# Patient Record
Sex: Male | Born: 1950 | Race: White | Hispanic: No | Marital: Married | Smoking: Current every day smoker
Health system: Southern US, Community
[De-identification: ages and names within clinical notes are randomized; demographics above are authoritative.]

## PROBLEM LIST (undated history)

## (undated) DIAGNOSIS — I10 Essential (primary) hypertension: Secondary | ICD-10-CM

---

## 2015-07-27 ENCOUNTER — Ambulatory Visit: Payer: Self-pay

## 2015-07-27 ENCOUNTER — Ambulatory Visit
Admission: EM | Admit: 2015-07-27 | Discharge: 2015-07-27 | Disposition: A | Discharge status: Home / Self Care | Payer: Self-pay | Attending: Family Medicine | Admitting: Family Medicine

## 2015-07-27 DIAGNOSIS — R509 Fever, unspecified: Secondary | ICD-10-CM | POA: Insufficient documentation

## 2015-07-27 DIAGNOSIS — E876 Hypokalemia: Secondary | ICD-10-CM | POA: Insufficient documentation

## 2015-07-27 DIAGNOSIS — R51 Headache: Secondary | ICD-10-CM | POA: Insufficient documentation

## 2015-07-27 DIAGNOSIS — R519 Headache, unspecified: Secondary | ICD-10-CM

## 2015-07-27 LAB — CBC WITH DIFFERENTIAL/PLATELET
Basophils Absolute: 0.1 10*3/uL (ref 0–0.1)
Basophils Relative: 1 %
Eosinophils Absolute: 0 10*3/uL (ref 0–0.7)
Eosinophils Relative: 0 %
HCT: 45.2 % (ref 40.0–52.0)
Hemoglobin: 15 g/dL (ref 13.0–18.0)
Lymphocytes Relative: 16 %
Lymphs Abs: 1.7 10*3/uL (ref 1.0–3.6)
MCH: 28.9 pg (ref 26.0–34.0)
MCHC: 33.2 g/dL (ref 32.0–36.0)
MCV: 87 fL (ref 80.0–100.0)
Monocytes Absolute: 0.9 10*3/uL (ref 0.2–1.0)
Monocytes Relative: 9 %
Neutro Abs: 7.7 10*3/uL — ABNORMAL HIGH (ref 1.4–6.5)
Neutrophils Relative %: 74 %
Platelets: 167 10*3/uL (ref 150–440)
RBC: 5.19 MIL/uL (ref 4.40–5.90)
RDW: 14.8 % — ABNORMAL HIGH (ref 11.5–14.5)
WBC: 10.5 10*3/uL (ref 3.8–10.6)

## 2015-07-27 LAB — COMPREHENSIVE METABOLIC PANEL
ALT: 31 U/L (ref 17–63)
AST: 35 U/L (ref 15–41)
Albumin: 4.3 g/dL (ref 3.5–5.0)
Alkaline Phosphatase: 106 U/L (ref 38–126)
Anion gap: 10 (ref 5–15)
BUN: 11 mg/dL (ref 6–20)
CO2: 23 mmol/L (ref 22–32)
Calcium: 8.8 mg/dL — ABNORMAL LOW (ref 8.9–10.3)
Chloride: 102 mmol/L (ref 101–111)
Creatinine, Ser: 1 mg/dL (ref 0.61–1.24)
GFR calc Af Amer: >60 mL/min (ref >60)
GFR calc non Af Amer: >60 mL/min (ref >60)
Glucose, Bld: 127 mg/dL — ABNORMAL HIGH (ref 65–99)
Potassium: 3.1 mmol/L — ABNORMAL LOW (ref 3.5–5.1)
Sodium: 135 mmol/L (ref 135–145)
Total Bilirubin: 1.4 mg/dL — ABNORMAL HIGH (ref 0.3–1.2)
Total Protein: 7.6 g/dL (ref 6.5–8.1)

## 2015-07-27 MED ORDER — NAPROXEN 500 MG PO TABS: 500.0000 mg | ORAL_TABLET | Freq: Two times a day (BID) | ORAL | Status: AC

## 2015-07-27 NOTE — Discharge Instructions (Signed)
Fever, Adult °A fever is a higher than normal body temperature. In an adult, an oral temperature around 98.6° F (37° C) is considered normal. A temperature of 100.4° F (38° C) or higher is generally considered a fever. Mild or moderate fevers generally have no long-term effects and often do not require treatment. Extreme fever (greater than or equal to 106° F or 41.1° C) can cause seizures. The sweating that may occur with repeated or prolonged fever may cause dehydration. Elderly people can develop confusion during a fever. °A measured temperature can vary with: °· Age. °· Time of day. °· Method of measurement (mouth, underarm, rectal, or ear). °The fever is confirmed by taking a temperature with a thermometer. Temperatures can be taken different ways. Some methods are accurate and some are not. °· An oral temperature is used most commonly. Electronic thermometers are fast and accurate. °· An ear temperature will only be accurate if the thermometer is positioned as recommended by the manufacturer. °· A rectal temperature is accurate and done for those adults who have a condition where an oral temperature cannot be taken. °· An underarm (axillary) temperature is not accurate and not recommended. °Fever is a symptom, not a disease.  °CAUSES  °· Infections commonly cause fever. °· Some noninfectious causes for fever include: °· Some arthritis conditions. °· Some thyroid or adrenal gland conditions. °· Some immune system conditions. °· Some types of cancer. °· A medicine reaction. °· High doses of certain street drugs such as methamphetamine. °· Dehydration. °· Exposure to high outside or room temperatures. °· Occasionally, the source of a fever cannot be determined. This is sometimes called a "fever of unknown origin" (FUO). °· Some situations may lead to a temporary rise in body temperature that may go away on its own. Examples are: °· Childbirth. °· Surgery. °· Intense exercise. °HOME CARE INSTRUCTIONS  °· Take  appropriate medicines for fever. Follow dosing instructions carefully. If you use acetaminophen to reduce the fever, be careful to avoid taking other medicines that also contain acetaminophen. Do not take aspirin for a fever if you are younger than age 19. There is an association with Reye's syndrome. Reye's syndrome is a rare but potentially deadly disease. °· If an infection is present and antibiotics have been prescribed, take them as directed. Finish them even if you start to feel better. °· Rest as needed. °· Maintain an adequate fluid intake. To prevent dehydration during an illness with prolonged or recurrent fever, you may need to drink extra fluid. Drink enough fluids to keep your urine clear or pale yellow. °· Sponging or bathing with room temperature water may help reduce body temperature. Do not use ice water or alcohol sponge baths. °· Dress comfortably, but do not over-bundle. °SEEK MEDICAL CARE IF:  °· You are unable to keep fluids down. °· You develop vomiting or diarrhea. °· You are not feeling at least partly better after 3 days. °· You develop new symptoms or problems. °SEEK IMMEDIATE MEDICAL CARE IF:  °· You have shortness of breath or trouble breathing. °· You develop excessive weakness. °· You are dizzy or you faint. °· You are extremely thirsty or you are making little or no urine. °· You develop new pain that was not there before (such as in the head, neck, chest, back, or abdomen). °· You have persistent vomiting and diarrhea for more than 1 to 2 days. °· You develop a stiff neck or your eyes become sensitive to light. °· You develop a   skin rash.  You have a fever or persistent symptoms for more than 2 to 3 days.  You have a fever and your symptoms suddenly get worse. MAKE SURE YOU:   Understand these instructions.  Will watch your condition.  Will get help right away if you are not doing well or get worse. Document Released: 05/22/2001 Document Revised: 04/12/2014 Document  Reviewed: 09/27/2011 [facility] Patient Information 2015 [place], [place]. This information is not intended to replace advice given to you by your health care provider. Make sure you discuss any questions you have with your health care provider.  Headaches, Frequently Asked Questions MIGRAINE HEADACHES Q: What is migraine? What causes it? How can I treat it? A: Generally, migraine headaches begin as a dull ache. Then they develop into a constant, throbbing, and pulsating pain. You may experience pain at the temples. You may experience pain at the front or back of one or both sides of the head. The pain is usually accompanied by a combination of:  Nausea.  Vomiting.  Sensitivity to light and noise. Some people (about 15%) experience an aura (see below) before an attack. The cause of migraine is believed to be chemical reactions in the brain. Treatment for migraine may include over-the-counter or prescription medications. It may also include self-help techniques. These include relaxation training and biofeedback.  Q: What is an aura? A: About 15% of people with migraine get an "aura". This is a sign of neurological symptoms that occur before a migraine headache. You may see wavy or jagged lines, dots, or flashing lights. You might experience tunnel vision or blind spots in one or both eyes. The aura can include visual or auditory hallucinations (something imagined). It may include disruptions in smell (such as strange odors), taste or touch. Other symptoms include:  Numbness.  A "pins and needles" sensation.  Difficulty in recalling or speaking the correct word. These neurological events may last as long as 60 minutes. These symptoms will fade as the headache begins. Q: What is a trigger? A: Certain physical or environmental factors can lead to or "trigger" a migraine. These include:  Foods.  Hormonal changes.  Weather.  Stress. It is important to remember that triggers are different for  everyone. To help prevent migraine attacks, you need to figure out which triggers affect you. Keep a headache diary. This is a good way to track triggers. The diary will help you talk to your healthcare professional about your condition. Q: Does weather affect migraines? A: Bright sunshine, hot, humid conditions, and drastic changes in barometric pressure may lead to, or "trigger," a migraine attack in some people. But studies have shown that weather does not act as a trigger for everyone with migraines. Q: What is the link between migraine and hormones? A: Hormones start and regulate many of your body's functions. Hormones keep your body in balance within a constantly changing environment. The levels of hormones in your body are unbalanced at times. Examples are during menstruation, pregnancy, or menopause. That can lead to a migraine attack. In fact, about three quarters of all women with migraine report that their attacks are related to the menstrual cycle.  Q: Is there an increased risk of stroke for migraine sufferers? A: The likelihood of a migraine attack causing a stroke is very remote. That is not to say that migraine sufferers cannot have a stroke associated with their migraines. In persons under age 61, the most common associated factor for stroke is migraine headache. But over the  course of a person's normal life span, the occurrence of migraine headache may actually be associated with a reduced risk of dying from cerebrovascular disease due to stroke.  Q: What are acute medications for migraine? A: Acute medications are used to treat the pain of the headache after it has started. Examples over-the-counter medications, NSAIDs, ergots, and triptans.  Q: What are the triptans? A: Triptans are the newest class of abortive medications. They are specifically targeted to treat migraine. Triptans are vasoconstrictors. They moderate some chemical reactions in the brain. The triptans work on receptors  in your brain. Triptans help to restore the balance of a neurotransmitter called serotonin. Fluctuations in levels of serotonin are thought to be a main cause of migraine.  Q: Are over-the-counter medications for migraine effective? A: Over-the-counter, or "OTC," medications may be effective in relieving mild to moderate pain and associated symptoms of migraine. But you should see your caregiver before beginning any treatment regimen for migraine.  Q: What are preventive medications for migraine? A: Preventive medications for migraine are sometimes referred to as "prophylactic" treatments. They are used to reduce the frequency, severity, and length of migraine attacks. Examples of preventive medications include antiepileptic medications, antidepressants, beta-blockers, calcium channel blockers, and NSAIDs (nonsteroidal anti-inflammatory drugs). Q: Why are anticonvulsants used to treat migraine? A: During the past few years, there has been an increased interest in antiepileptic drugs for the prevention of migraine. They are sometimes referred to as "anticonvulsants". Both epilepsy and migraine may be caused by similar reactions in the brain.  Q: Why are antidepressants used to treat migraine? A: Antidepressants are typically used to treat people with depression. They may reduce migraine frequency by regulating chemical levels, such as serotonin, in the brain.  Q: What alternative therapies are used to treat migraine? A: The term "alternative therapies" is often used to describe treatments considered outside the scope of conventional Western medicine. Examples of alternative therapy include acupuncture, acupressure, and yoga. Another common alternative treatment is herbal therapy. Some herbs are believed to relieve headache pain. Always discuss alternative therapies with your caregiver before proceeding. Some herbal products contain arsenic and other toxins. TENSION HEADACHES Q: What is a tension-type  headache? What causes it? How can I treat it? A: Tension-type headaches occur randomly. They are often the result of temporary stress, anxiety, fatigue, or anger. Symptoms include soreness in your temples, a tightening band-like sensation around your head (a "vice-like" ache). Symptoms can also include a pulling feeling, pressure sensations, and contracting head and neck muscles. The headache begins in your forehead, temples, or the back of your head and neck. Treatment for tension-type headache may include over-the-counter or prescription medications. Treatment may also include self-help techniques such as relaxation training and biofeedback. CLUSTER HEADACHES Q: What is a cluster headache? What causes it? How can I treat it? A: Cluster headache gets its name because the attacks come in groups. The pain arrives with little, if any, warning. It is usually on one side of the head. A tearing or bloodshot eye and a runny nose on the same side of the headache may also accompany the pain. Cluster headaches are believed to be caused by chemical reactions in the brain. They have been described as the most severe and intense of any headache type. Treatment for cluster headache includes prescription medication and oxygen. SINUS HEADACHES Q: What is a sinus headache? What causes it? How can I treat it? A: When a cavity in the bones of the face and skull (  a sinus) becomes inflamed, the inflammation will cause localized pain. This condition is usually the result of an allergic reaction, a tumor, or an infection. If your headache is caused by a sinus blockage, such as an infection, you will probably have a fever. An x-ray will confirm a sinus blockage. Your caregiver's treatment might include antibiotics for the infection, as well as antihistamines or decongestants.  REBOUND HEADACHES Q: What is a rebound headache? What causes it? How can I treat it? A: A pattern of taking acute headache medications too often can lead  to a condition known as "rebound headache." A pattern of taking too much headache medication includes taking it more than 2 days per week or in excessive amounts. That means more than the label or a caregiver advises. With rebound headaches, your medications not only stop relieving pain, they actually begin to cause headaches. Doctors treat rebound headache by tapering the medication that is being overused. Sometimes your caregiver will gradually substitute a different type of treatment or medication. Stopping may be a challenge. Regularly overusing a medication increases the potential for serious side effects. Consult a caregiver if you regularly use headache medications more than 2 days per week or more than the label advises. ADDITIONAL QUESTIONS AND ANSWERS Q: What is biofeedback? A: Biofeedback is a self-help treatment. Biofeedback uses special equipment to monitor your body's involuntary physical responses. Biofeedback monitors:  Breathing.  Pulse.  Heart rate.  Temperature.  Muscle tension.  Brain activity. Biofeedback helps you refine and perfect your relaxation exercises. You learn to control the physical responses that are related to stress. Once the technique has been mastered, you do not need the equipment any more. Q: Are headaches hereditary? A: Four out of five (80%) of people that suffer report a family history of migraine. Scientists are not sure if this is genetic or a family predisposition. Despite the uncertainty, a child has a 50% chance of having migraine if one parent suffers. The child has a 75% chance if both parents suffer.  Q: Can children get headaches? A: By the time they reach high school, most young people have experienced some type of headache. Many safe and effective approaches or medications can prevent a headache from occurring or stop it after it has begun.  Q: What type of doctor should I see to diagnose and treat my headache? A: Start with your primary  caregiver. Discuss his or her experience and approach to headaches. Discuss methods of classification, diagnosis, and treatment. Your caregiver may decide to recommend you to a headache specialist, depending upon your symptoms or other physical conditions. Having diabetes, allergies, etc., may require a more comprehensive and inclusive approach to your headache. The [employer] will provide, upon request, a list of [facility] physician members in your state. Document Released: 02/16/2004 Document Revised: 02/18/2012 Document Reviewed: 07/26/2008 [facility] Patient Information 2015 [place], [place]. This information is not intended to replace advice given to you by your health care provider. Make sure you discuss any questions you have with your health care provider.  Hypokalemia Hypokalemia means that the amount of potassium in the blood is lower than normal.Potassium is a chemical, called an electrolyte, that helps regulate the amount of fluid in the body. It also stimulates muscle contraction and helps nerves function properly.Most of the body's potassium is inside of cells, and only a very small amount is in the blood. Because the amount in the blood is so small, minor changes can be life-threatening. CAUSES  Antibiotics.  Diarrhea or vomiting.  Using laxatives too much, which can cause diarrhea.  Chronic kidney disease.  Water pills (diuretics).  Eating disorders (bulimia).  Low magnesium level.  Sweating a lot. SIGNS AND SYMPTOMS  Weakness.  Constipation.  Fatigue.  Muscle cramps.  Mental confusion.  Skipped heartbeats or irregular heartbeat (palpitations).  Tingling or numbness. DIAGNOSIS  Your health care provider can diagnose hypokalemia with blood tests. In addition to checking your potassium level, your health care provider may also check other lab tests. TREATMENT Hypokalemia can be treated with potassium supplements taken by mouth or adjustments in your  current medicines. If your potassium level is very low, you may need to get potassium through a vein (IV) and be monitored in the hospital. A diet high in potassium is also helpful. Foods high in potassium are:  Nuts, such as peanuts and pistachios.  Seeds, such as sunflower seeds and pumpkin seeds.  Peas, lentils, and lima beans.  Whole grain and bran cereals and breads.  Fresh fruit and vegetables, such as apricots, avocado, bananas, cantaloupe, kiwi, oranges, tomatoes, asparagus, and potatoes.  Orange and tomato juices.  Red meats.  Fruit yogurt. HOME CARE INSTRUCTIONS  Take all medicines as prescribed by your health care provider.  Maintain a healthy diet by including nutritious food, such as fruits, vegetables, nuts, whole grains, and lean meats.  If you are taking a laxative, be sure to follow the directions on the label. SEEK MEDICAL CARE IF:  Your weakness gets worse.  You feel your heart pounding or racing.  You are vomiting or having diarrhea.  You are diabetic and having trouble keeping your blood glucose in the normal range. SEEK IMMEDIATE MEDICAL CARE IF:  You have chest pain, shortness of breath, or dizziness.  You are vomiting or having diarrhea for more than 2 days.  You faint. MAKE SURE YOU:   Understand these instructions.  Will watch your condition.  Will get help right away if you are not doing well or get worse. Document Released: 11/26/2005 Document Revised: 09/16/2013 Document Reviewed: 05/29/2013 [facility] Patient Information 2015 [place], [place]. This information is not intended to replace advice given to you by your health care provider. Make sure you discuss any questions you have with your health care provider.

## 2015-07-27 NOTE — ED Provider Notes (Addendum)
CSN: [id number]     Arrival date & time 07/27/15  0912 History   First MD Initiated Contact with Patient 07/27/15 [phone]     No chief complaint on file.  (Consider location/radiation/quality/duration/timing/severity/associated sxs/prior Treatment) HPI   This is a 64 year old male who presents with headache for 2 days he states was a worse he's ever had pain graded at 9/10 shortness of breath and feels like he is unable to take in a complete breath but no cough. If fever upon presentation of 101.3 O2 sat of 99% and a BP of 151/92. Respirations are 18. He has not been noted to sleep well because of the pain from his headache. He denies any rashes or any neurological symptoms. He denies any photophobia sensitivity has had no loss of function or any numbness or tingling. 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 He has worked there for 5 months. Sates the area is very hot with a lot of metal shavings from drilling. He denies any syncope or near-syncope. He has no other sources of pain other than feeling achy. Does not have any neck stiffness. Denies any nausea vomiting diarrhea or urinary symptoms.  No past medical history on file. No past surgical history on file. No family history on file. Social History  Substance Use Topics  . Smoking status: Not on file  . Smokeless tobacco: Not on file  . Alcohol Use: Not on file    Review of Systems  Constitutional: Positive for fever, chills and fatigue.  Eyes: Negative for photophobia, pain and visual disturbance.  Respiratory: Positive for shortness of breath.   Skin: Negative for color change, pallor and rash.  All other systems reviewed and are negative.   Allergies  Review of patient's allergies indicates not on file.  Home Medications   Prior to Admission medications   Medication Sig Start Date End Date Taking? Authorizing Provider  naproxen (NAPROSYN) 500 MG tablet Take 1 tablet (500  mg total) by mouth 2 (two) times daily with a meal. 07/27/15   Lutricia Feil, PA-C   BP 133/77 mmHg  Pulse 75  Temp(Src) 98.3 F (36.8 C) (Oral)  Resp 16  SpO2 100% Physical Exam  Constitutional: He is oriented to person, place, and time. He appears well-developed and well-nourished.  HENT:  Head: Normocephalic and atraumatic.  Right Ear: External ear normal.  Left Ear: External ear normal.  Mouth/Throat: Oropharynx is clear and moist.  Eyes: EOM are normal. Pupils are equal, round, and reactive to light. Right eye exhibits no discharge. Left eye exhibits no discharge. No scleral icterus.  Neck: Normal range of motion. Neck supple. No thyromegaly present.  Cardiovascular: Normal rate, regular rhythm and normal heart sounds.  Exam reveals no gallop and no friction rub.   No murmur heard. Pulmonary/Chest: Effort normal and breath sounds normal. No stridor. No respiratory distress. He has no wheezes. He has no rales.  Abdominal: Soft. Bowel sounds are normal. There is no tenderness.  Musculoskeletal: Normal range of motion. He exhibits no edema or tenderness.  Lymphadenopathy:    He has no cervical adenopathy.  Neurological: He is alert and oriented to person, place, and time. He has normal reflexes. He displays normal reflexes. No cranial nerve deficit. He exhibits normal muscle tone. Coordination normal.  Skin: Skin is warm and dry. No rash noted. No erythema.  Psychiatric: He has a normal mood and affect. His behavior is normal. Judgment and  thought content normal.  Nursing note and vitals reviewed.   ED Course  Procedures (including critical care time) Labs Review Labs Reviewed  CBC WITH DIFFERENTIAL/PLATELET - Abnormal; Notable for the following:    RDW 14.8 (*)    Neutro Abs 7.7 (*)    All other components within normal limits  COMPREHENSIVE METABOLIC PANEL - Abnormal; Notable for the following:    Potassium 3.1 (*)    Glucose, Bld 127 (*)    Calcium 8.8 (*)    Total  Bilirubin 1.4 (*)    All other components within normal limits    Imaging Review No results found.   MDM   1. Acute nonintractable headache, unspecified headache type   2. Hypokalemia   3. Fever, unspecified fever cause    Discharge Medication List as of 07/27/2015 11:14 AM    START taking these medications   Details  naproxen (NAPROSYN) 500 MG tablet Take 1 tablet (500 mg total) by mouth 2 (two) times daily with a meal., Starting 07/27/2015, Until Discontinued, Print       Plan: 1. Test/x-ray results and diagnosis reviewed with patient 2. rx as per orders; risks, benefits, potential side effects reviewed with patient 3. Recommend supportive treatment with increase foods rich in potassium. Frequent fluids. Check frequently for ticks after walking dog. 4. F/u prn if symptoms worsen or don't improve  I had a long discussion with the patient regarding his findings and my examination. Is very concerning that he appeared with a fever and the worst headache of his life along with some mild body aches. Patient was fully alert and coherent but Complaining of the headache that he had for 2 days. He works in a very hot environment at [employer] in the [place] [employer]. Neurological examination failed to show any abnormalities, he had no no nuchal rigidity, and he appeared nontoxic. He states that the only woody area that he encounters with his dog is when they walk 3 times a day. Most of these areas are grassy with a few bushes. He has not noticed any ticks and does not have any rash that he has noticed. His blood work showed upper limits of normal of the white count with increased neutrophils, hypokalemia of 3.1 but everything else otherwise normal. He was given Tylenol for his fever and he states that after his fever broke his headache had completely resolved and is feeling much better. I stressed to him of our concern for his fever and headache and recommended obtaining [facility] spotted  fever titers as well as referring him to the emergency room for a more in-depth workup to possibly include CT scan and spinal tap if deemed necessary. However he does not have health insurance and did not care to run up a large bill and declined to go. He wanted to wait and see what happens". I told him that is his right but still I recommend further workup. His symptoms could be heat related since it is been so hot recently in the area and of possible tick fever as part of the differential as is early meningitis. Despite that he still wishes to delay any further workup since he was feeling much improved without any headache  like the previous 2 days. I advised him that if he notices any change- any return of his headache, fever or rash he should immediately go to the emergency department. He stated he understood. Have provided him with some Naprosyn for any additional headache  or fever that he may have.  Lutricia Feil, PA-C 07/27/15 1143  07/29/2015  Received a phone call from Mr. Levander Campion tonight stating that he was having dizziness and having up out of body feeling when he is taking the Naprosyn. His supervisor also said that he could not work with those symptoms and he wanted to be seen today. When I spoke with them he stated that he was 100% improved and sounded very good on the phone. His main concern was the effect of the Naprosyn and I've advised him that he does not need to take it only as necessary. Problem he can cut it in half and take one half twice a day or even one half a day and supplemented with Tylenol. At any rate I have asked him that if he does have symptoms again that he should go to the emergency room were a more thorough workup could be performed or he should obtain a PCP that he can go to. He will try the half Naprosyn dosage when necessary but to reiterate he was feeling 100% better.  Lutricia Feil, PA-C 07/29/15 2153

## 2016-01-19 IMAGING — CR DG CHEST 2V
2 series · 2 of 2 positions shown · non-contrast
Comparison: None.

CLINICAL DATA: 64-year-old male with headache and shortness of
breath for 2 days. Nonsmoker. Initial encounter.

EXAM:
CHEST  2 VIEW

[chest pa]
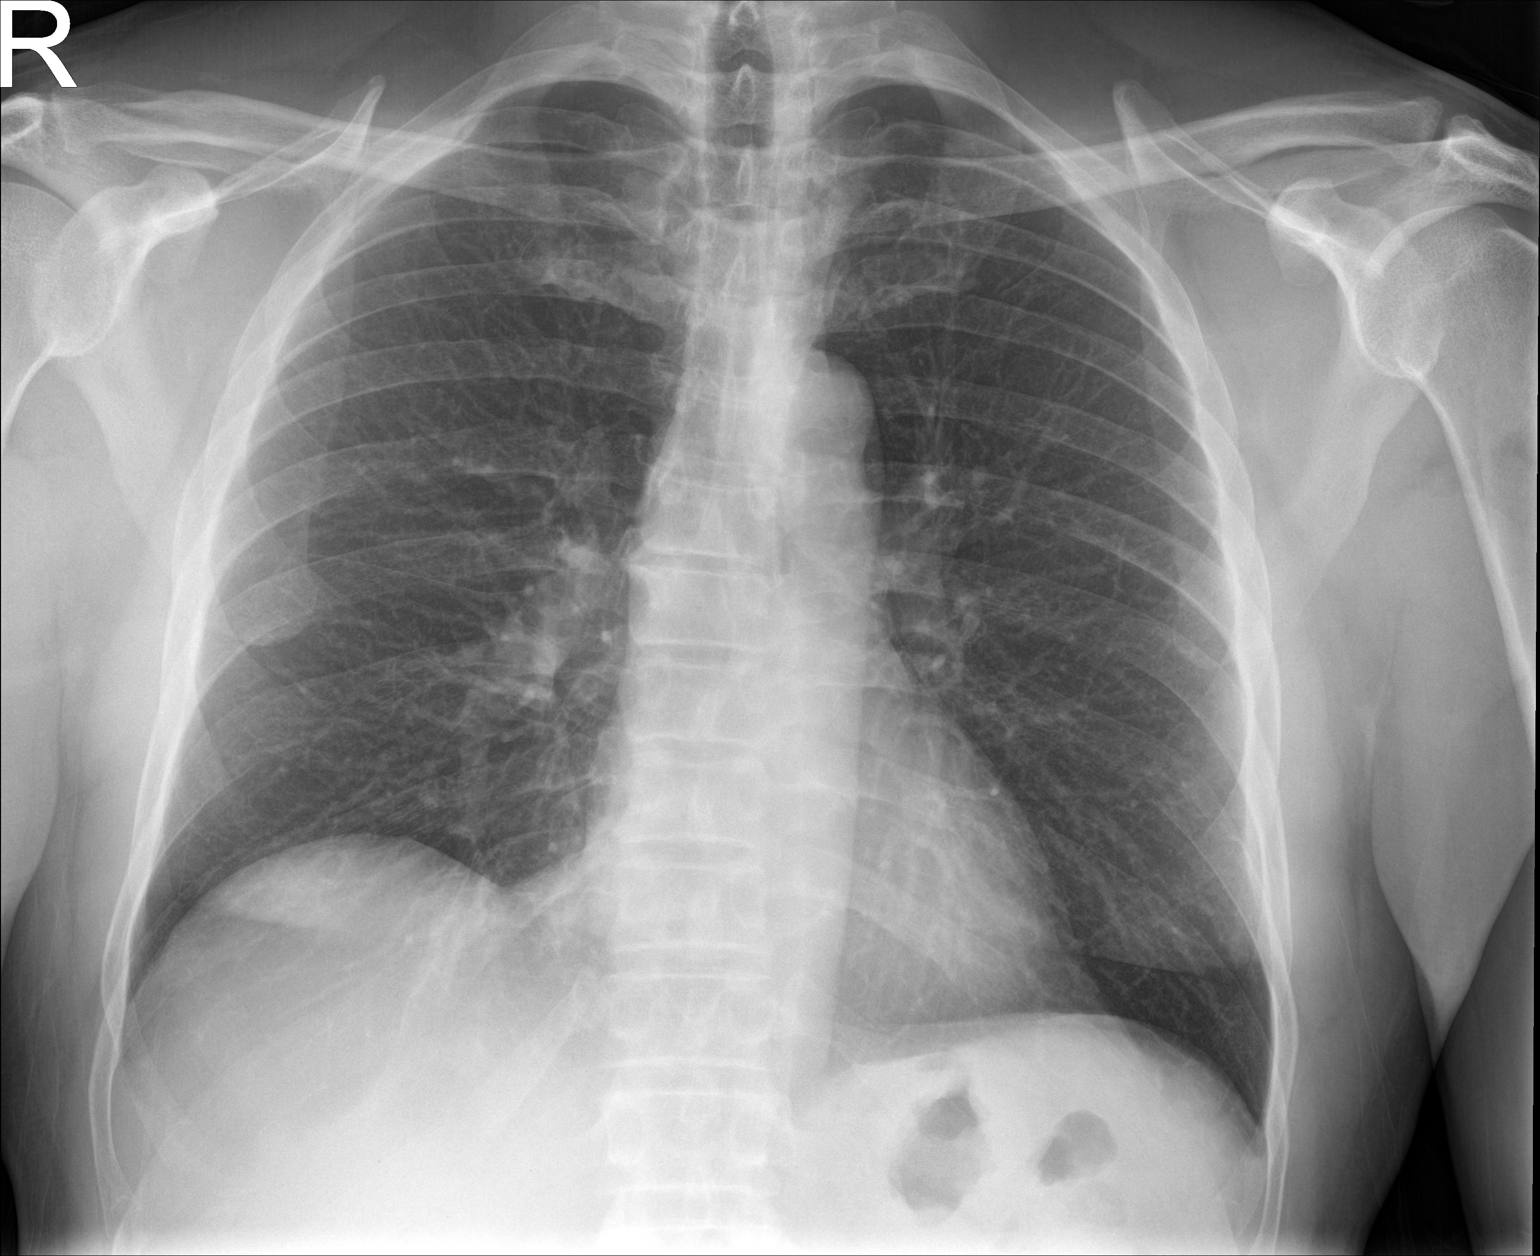

[chest lat]
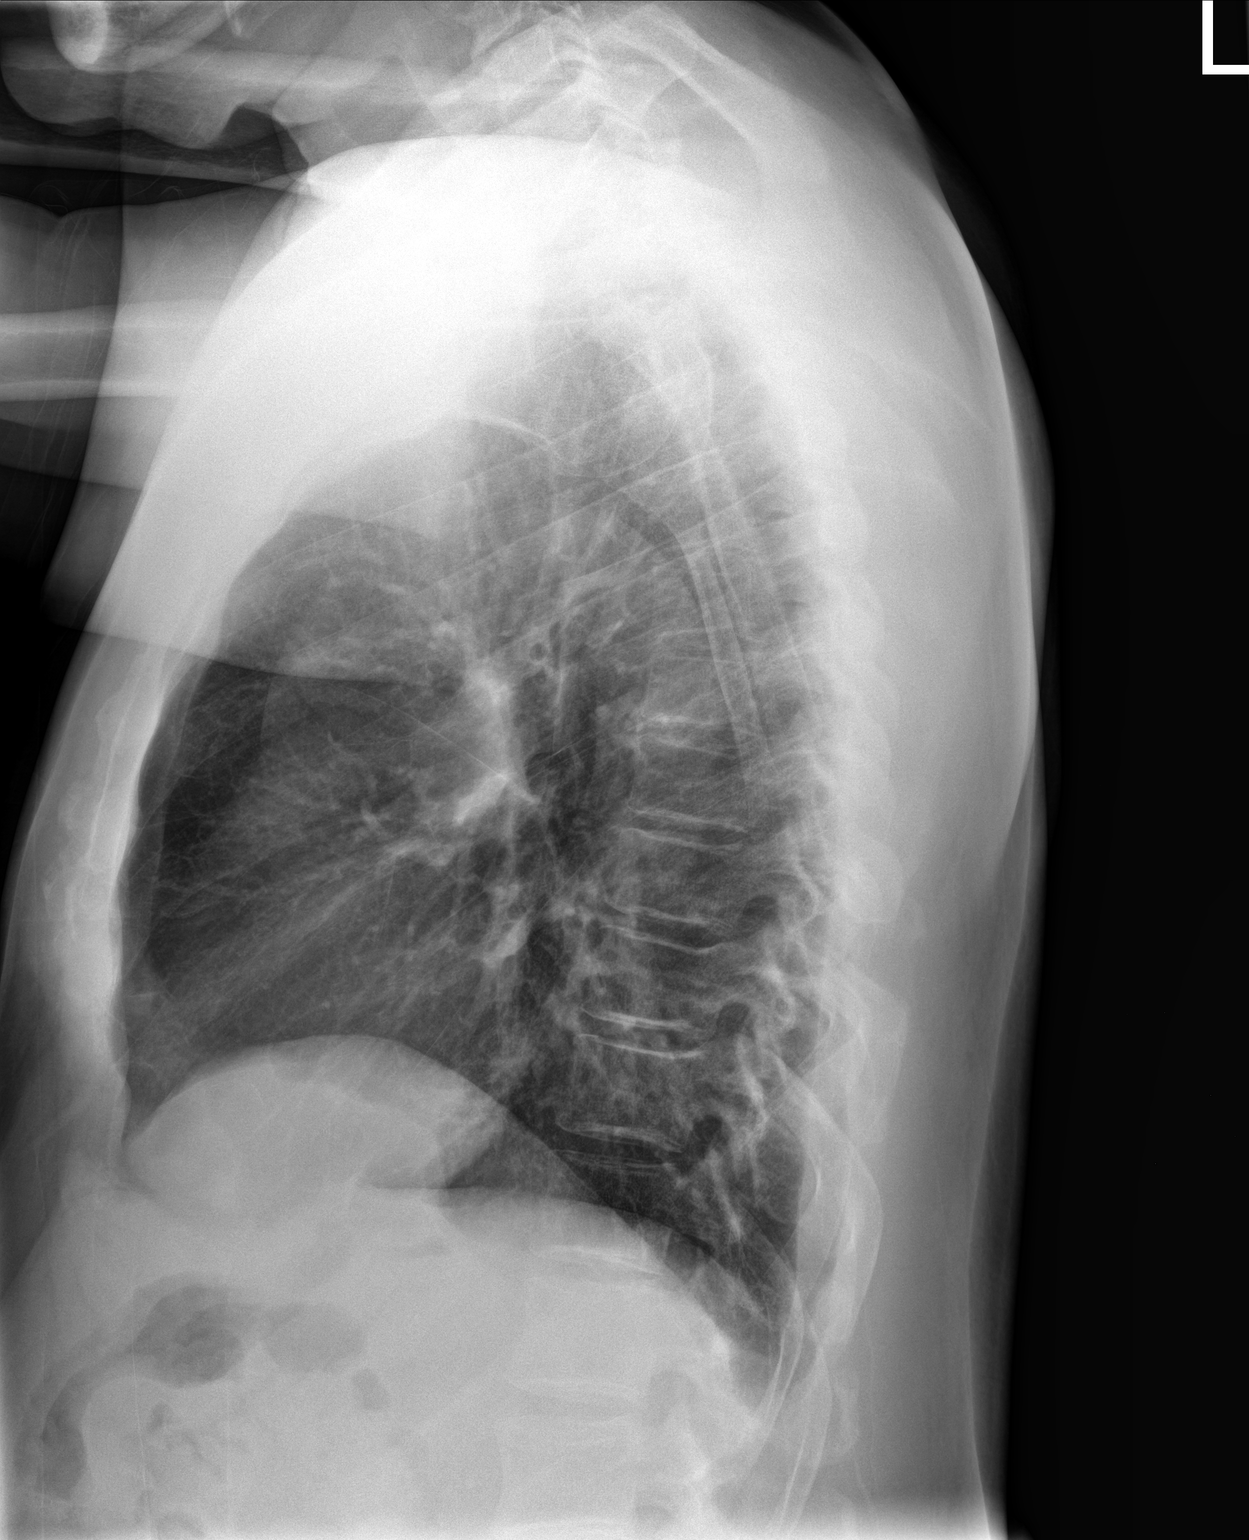

[2 of 2 positions shown; findings below may reference images not displayed]

FINDINGS: Normal lung volumes, mild elevation of the right hemidiaphragm.
Normal cardiac size and mediastinal contours. Visualized tracheal
air column is within normal limits. No pneumothorax or pulmonary
edema. No pleural effusion or confluent pulmonary opacity. No acute
osseous abnormality identified.
IMPRESSION: Negative, no acute cardiopulmonary abnormality.

## 2017-02-08 ENCOUNTER — Encounter: Payer: Self-pay | Admitting: Gynecology

## 2017-02-08 ENCOUNTER — Ambulatory Visit
Admission: EM | Admit: 2017-02-08 | Discharge: 2017-02-08 | Disposition: A | Discharge status: Home / Self Care | Payer: Self-pay | Attending: Family Medicine | Admitting: Family Medicine

## 2017-02-08 DIAGNOSIS — S0501XA Injury of conjunctiva and corneal abrasion without foreign body, right eye, initial encounter: Secondary | ICD-10-CM

## 2017-02-08 HISTORY — DX: Essential (primary) hypertension: I10

## 2017-02-08 MED ORDER — GENTAMICIN SULFATE 0.3 % OP SOLN: 2.0000 [drp] | OPHTHALMIC | 0 refills | Status: AC

## 2017-02-08 MED ORDER — GENTAMICIN SULFATE 0.3 % OP SOLN: 2.0000 [drp] | OPHTHALMIC | 0 refills | Status: DC

## 2017-02-08 NOTE — ED Provider Notes (Signed)
CSN: [id number]     Arrival date & time 02/08/17  1710 History   First MD Initiated Contact with Patient 02/08/17 1949     Chief Complaint  Patient presents with  . Eye Injury   (Consider location/radiation/quality/duration/timing/severity/associated sxs/prior Treatment) HPI  This 66 year old male who presents with right eye redness. He states that days ago while removing a ceiling from a garage and not wearing his safety glasses he got debris in his right eye. When he was having some pain and redness. 2 days ago he started using saltwater in a shot class rinsing his eye. States now does not have any pain. Denies any visual disturbances. He has had no itching in the eye. He said no discharge from the eye.       Past Medical History:  Diagnosis Date  . Hypertension    History reviewed. No pertinent surgical history. No family history on file. Social History  Substance Use Topics  . Smoking status: Current Every Day Smoker  . Smokeless tobacco: Never Used  . Alcohol use No    Review of Systems  Constitutional: Positive for activity change. Negative for chills, fatigue and fever.  Eyes: Positive for redness. Negative for photophobia, pain, discharge, itching and visual disturbance.  All other systems reviewed and are negative.   Allergies  Patient has no known allergies.  Home Medications   Prior to Admission medications   Medication Sig Start Date End Date Taking? Authorizing Provider  amLODipine (NORVASC) 10 MG tablet Take 10 mg by mouth daily.   Yes Historical Provider, MD  gentamicin (GARAMYCIN) 0.3 % ophthalmic solution Place 2 drops into the right eye every 4 (four) hours. 02/08/17   Lutricia FeilWilliam P Lemuel Boodram, PA-C  naproxen (NAPROSYN) 500 MG tablet Take 1 tablet (500 mg total) by mouth 2 (two) times daily with a meal. 07/27/15   Lutricia FeilWilliam P Rome Echavarria, PA-C   Meds Ordered and Administered this Visit  Medications - No data to display  BP (!) 168/92 (BP Location: Left Arm)   Pulse 75    Temp 98 F (36.7 C) (Oral)   Resp 16   Ht 5\' 10"  (1.778 m)   Wt 177 lb (80.3 kg)   SpO2 100%   BMI 25.40 kg/m  No data found.   Physical Exam  Constitutional: He is oriented to person, place, and time. He appears well-developed and well-nourished. No distress.  HENT:  Head: Normocephalic and atraumatic.  Eyes: EOM and lids are normal. Pupils are equal, round, and reactive to light. Lids are everted and swept, no foreign bodies found. Right eye exhibits chemosis. Right conjunctiva has a hemorrhage.    Exam nation of the right eye shows subconjunctival hemorrhage on the medial aspect. No bodies are found. The eyelid was everted with no foreign bodies found. The eye was anesthetized with tetracaine ophthalmic and florescence stain was then applied. Woods lamp visualization a large scar type area just inferior to the pupil is in the cornea. Acuity is as recorded.  Neck: Normal range of motion. Neck supple.  Musculoskeletal: Normal range of motion.  Neurological: He is alert and oriented to person, place, and time.  Skin: Skin is warm and dry. He is not diaphoretic.  Psychiatric: He has a normal mood and affect. His behavior is normal. Judgment and thought content normal.  Nursing note and vitals reviewed.   Urgent Care Course     Procedures (including critical care time)  Labs Review Labs Reviewed - No data to display  Imaging  Review No results found.   Visual Acuity Review  Right Eye Distance: 20/30 Left Eye Distance: 20/30 Bilateral Distance: 20/25  Right Eye Near:   Left Eye Near:    Bilateral Near:         MDM   1. Abrasion of right cornea, initial encounter    Discharge Medication List as of 02/08/2017  8:09 PM    START taking these medications   Details  gentamicin (GARAMYCIN) 0.3 % ophthalmic solution Place 2 drops into the right eye every 4 (four) hours., Starting Fri 02/08/2017, Normal      Plan: 1. Test/x-ray results and diagnosis reviewed with  patient 2. rx as per orders; risks, benefits, potential side effects reviewed with patient 3. Recommend supportive treatment with use of the antibiotic I prescribed. Patient will contact [facility] on Monday for an appointment on Tuesday since he does not want to miss work. Told him it's imperative that he be seen to ascertain if the abnormality on florescence stain needs to be treated. 4. F/u prn if symptoms worsen or don't improve     Lutricia Feil, PA-C 02/08/17 2201

## 2017-02-08 NOTE — ED Triage Notes (Signed)
Per patient was moving a ceiling from a garage x 1 week ago when something fell into his right eye.
